# Patient Record
Sex: Male | Born: 1985 | Race: Black or African American | Hispanic: No | Marital: Married | Smoking: Former smoker
Health system: Southern US, Community
[De-identification: ages and names within clinical notes are randomized; demographics above are authoritative.]

---

## 2009-09-10 MED ORDER — TETANUS AND DIPHTHERIA TOX (PF) 5 LF UNIT-2 LF UNIT/0.5 ML IM SYRINGE
5-2 Lf unit/0.5 mL | Freq: Once | INTRAMUSCULAR | Status: AC
Start: 2009-09-10 — End: 2009-09-10
  Administered 2009-09-10: 18:00:00 via INTRAMUSCULAR
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
## 2013-06-18 ENCOUNTER — Inpatient Hospital Stay
Admit: 2013-06-18 | Discharge: 2013-06-18 | Disposition: A | Discharge status: Home / Self Care | Attending: Emergency Medicine

## 2013-06-18 MED ORDER — CYCLOBENZAPRINE HCL 10 MG PO TABS
10 MG | ORAL_TABLET | Freq: Three times a day (TID) | ORAL | Status: AC | PRN
Start: 2013-06-18 — End: ?

## 2013-06-18 MED ORDER — IBUPROFEN 800 MG PO TABS
800 MG | ORAL_TABLET | Freq: Three times a day (TID) | ORAL | Status: AC | PRN
Start: 2013-06-18 — End: ?

## 2013-06-18 MED ORDER — PREDNISONE 50 MG PO TABS
50 MG | ORAL_TABLET | Freq: Every day | ORAL | Status: AC
Start: 2013-06-18 — End: 2013-06-23

## 2013-06-18 MED ORDER — TRAMADOL HCL 50 MG PO TABS
50 MG | ORAL_TABLET | Freq: Four times a day (QID) | ORAL | Status: AC | PRN
Start: 2013-06-18 — End: ?

## 2013-06-18 MED ADMIN — ondansetron (ZOFRAN) tablet 4 mg: 4 mg | ORAL | @ 14:00:00 | NDC 51079052401

## 2013-06-18 MED ADMIN — HYDROmorphone HCl PF (DILAUDID) injection 1 mg: 1 mg | INTRAMUSCULAR | @ 14:00:00 | NDC 00409128331

## 2013-06-18 MED FILL — HYDROMORPHONE HCL PF 1 MG/ML IJ SOLN: 1 MG/ML | INTRAMUSCULAR | Qty: 1

## 2013-06-18 MED FILL — ONDANSETRON HCL 4 MG PO TABS: 4 MG | ORAL | Qty: 1

## 2013-06-18 NOTE — ED Provider Notes (Signed)
[facility]    Pt Name: Gabriel Mitchell  MRN: [id number]  [place] 1985/10/08  Date of evaluation: 06/18/2013      CHIEF COMPLAINT       Chief Complaint   Patient presents with   ??? Back Pain     Pt c/o sudden onset of middle/low back pain while working out rowing. States this has happened 5-6 times before. States pain radiates to left hip.         HISTORY OF PRESENT ILLNESS     Gabriel Mitchell is a 28 y.o. male who presents with back pain.  Back pain be started 2 hours ago and is a 7 out of a 10 and sharp stabbing sensation.  The patient has been using over-the-counter medication without relief of symptoms.  The pain does not radiate down either leg.  There is no leg weakness or numbness.  There is no cervical or thoracic pain.  There was no direct trauma to the back.  There are no associated abdominal symptoms.  There are no bowel or bladder problems.  There's been no fever or other constitutional symptoms.  Brought here by relatives.          REVIEW OF SYSTEMS         REVIEW OF SYSTEMS    Constitutional:  Denies fever, chills, or weakness   Eyes:  Denies vision changes  HEENT:  Denies sore throat or neck pain   Respiratory:  Denies cough or shortness of breath   Cardiovascular:  Denies chest pain  GI:  Denies abdominal pain, nausea, vomiting, or diarrhea   Musculoskeletal:  As above  Skin:  No rash on exposed surfaces   Neurologic:  Normal baseline mentation. No new deficits.  Lymphatic:   No nodes or infection  Psychiatric:  No psychosis. Alert and interacting normally.  Other ROS negative except as noted above.    PAST MEDICAL HISTORY    has no past medical history on file.    SURGICAL HISTORY      has no past surgical history on file.    CURRENT MEDICATIONS       Previous Medications    No medications on file       ALLERGIES     has No Known Allergies.    FAMILY HISTORY     has no family status information on file.    family history is not on file.    SOCIAL HISTORY      reports that he has never  smoked. He does not have any smokeless tobacco history on file. He reports that he drinks alcohol. He reports that he does not use illicit drugs.    PHYSICAL EXAM     INITIAL VITALS:  height is 5' 9"$  (1.753 m) and weight is 97.523 kg (215 lb). His oral temperature is 97.9 ??F (36.6 ??C). His blood pressure is 122/70 and his pulse is 68. His respiration is 16 and oxygen saturation is 98%.     Constitutional: The patient is alert and well-developed, vital signs as noted.   Psychiatric: Oriented to time, place and person, affect is appropriate for age.   Eyes: Pupils equal and reactive to light.   Ears, nose, and throat: Oropharynx clear  Neck: No masses, trachea midline, and no thyromegaly.   Chest: Without deformities, chest wall symmetrical, there is no tenderness with palpation.   Respiratory: Clear to auscultation, full aeration throughout all fields.   Cardiovascular: No murmurs, heart sounds normal, no thrills.  Gastrointestinal: No masses, no hepatosplenomegaly, bowel sounds positive.  No tenderness.  Skin: No rashes or lesions on exposed surfaces, good skin turgor.   Neurological: Deep tendon reflexes 2+, sensation intact bilaterally, no focal neurological findings.  Extremities: Good range of motion, no edema.  Back: The lumbar spine demonstrates tenderness palpation of the paravertebral musculature without midline tenderness.  There is decreased range of motion due to pain.  No pain to palpation of the spinous processes and there is no step-off.  The cervical and thoracic exams are normal.      DIFFERENTIAL DIAGNOSIS/ MEDICAL DECISION MAKING:     Prednisone Flexeril and Ultram as advised    Dilaudid and Zofran are given with pain relief    Ambulatory without deficit    Follow Exit Care instructions closely.    I have reviewed the disposition diagnosis with the patient and or their family/guardian. I have answered their questions and given discharge instructions. They voiced understanding of these instructions  and did not have any further questions or complaints.    DIAGNOSTIC RESULTS     RADIOLOGY:   Non-plain film images such as CT, Ultrasound and MRI are read by the radiologist. Plain radiographic images are visualized and preliminarily interpreted by the emergency physician with the below findings:       LABS:  No results found for this visit on 06/18/13.    ABNORMAL LABS:  Labs Reviewed - No data to display     EKG: All EKG's are interpreted by the Emergency Department Physician who either signs or Co-signs this chart in the absence of a cardiologist.        EMERGENCY DEPARTMENT COURSE:   Vitals:    Filed Vitals:    06/18/13 0918   BP: 122/70   Pulse: 68   Temp: 97.9 ??F (36.6 ??C)   TempSrc: Oral   Resp: 16   Height: 5' 9"$  (1.753 m)   Weight: 97.523 kg (215 lb)   SpO2: 98%     -------------------------  BP: 122/70 mmHg, Temp: 97.9 ??F (36.6 ??C), Pulse: 68, Resp: 16    See DDX/MDM (Differential Diagnosis/Medical Decision Making) above      FINAL IMPRESSION      1. Lumbar strain, initial encounter          DISPOSITION/PLAN   DISPOSITION Decision to Discharge    Condition on Disposition    Fair    PATIENT REFERRED TO:  Julien Girt, MD  [address]  [address]  [place] [address]  [phone]    In 2 days        DISCHARGE MEDICATIONS:  New Prescriptions    CYCLOBENZAPRINE (FLEXERIL) 10 MG TABLET    Take 0.5 tablets by mouth 3 times daily as needed for Muscle spasms    IBUPROFEN (ADVIL;MOTRIN) 800 MG TABLET    Take 1 tablet by mouth every 8 hours as needed for Pain    PREDNISONE (DELTASONE) 50 MG TABLET    Take 1 tablet by mouth daily for 5 days       (Please note that portions of this note were completed with a voice recognition program.  Efforts were made to edit the dictations but occasionally words are mis-transcribed.)    Alexis Goodell Alinah Sheard,, DO  Attending Emergency Physician        [place], DO  06/18/13 [phone]

## 2013-06-18 NOTE — ED Notes (Signed)
Dr. Fought at bedside for re-eval.    Charm RingsKatie Gerken, RN  06/18/13 [phone]

## 2017-10-25 ENCOUNTER — Encounter (HOSPITAL_COMMUNITY): Payer: Self-pay | Admitting: *Deleted

## 2017-10-25 ENCOUNTER — Emergency Department (HOSPITAL_COMMUNITY)
Admission: EM | Admit: 2017-10-25 | Discharge: 2017-10-25 | Disposition: A | Discharge status: Home / Self Care | Attending: Emergency Medicine | Admitting: Emergency Medicine

## 2017-10-25 DIAGNOSIS — F17210 Nicotine dependence, cigarettes, uncomplicated: Secondary | ICD-10-CM | POA: Insufficient documentation

## 2017-10-25 DIAGNOSIS — M545 Low back pain, unspecified: Secondary | ICD-10-CM

## 2017-10-25 MED ORDER — KETOROLAC TROMETHAMINE 60 MG/2ML IM SOLN
60.0000 mg | Freq: Once | INTRAMUSCULAR | Status: AC
  Administered 2017-10-25: 60 mg via INTRAMUSCULAR
  Filled 2017-10-25: qty 2

## 2017-10-25 MED ORDER — METHOCARBAMOL 500 MG PO TABS: 500.0000 mg | ORAL_TABLET | Freq: Three times a day (TID) | ORAL | 0 refills | Status: AC

## 2017-10-25 NOTE — Discharge Instructions (Addendum)
You you were seen in the emergency department for back pain.  I suspect your pain is either from a muscular overuse injury, spasm or low grade strain.  We will treat this with a combination of anti-inflammatories, muscle relaxers, stretches, rest. 1000 mg of acetaminophen (Tylenol) every 8 hours for the next 3-5 days 600 mg of ibuprofen (motrin, advil) every 8 hours for the next 3-5 days 500 mg methocarbamol (robaxin) every 8 hours for the next 3 days for associated spasms and tightness Rest for the next 48 hours to avoid further injury Transition back into daily activity slowly to avoid reinjury Over-the-counter lidocaine-containing patches can be applied to the area of pain for 12 hours at a time Light massage, stretch, heat   Return to the ER if your pain worsens, you develop abdominal pain, urinary symptoms, changes to your bowel movements, numbness in your groin, loss of bladder or bowel control, loss of sensation or heaviness or weakness to your extremities, fevers

## 2017-10-25 NOTE — ED Triage Notes (Signed)
Pt in stating he threw his back out this morning, he was carrying someone and had sudden onset of pain, ambulatory to room without distress, has taken two 800mg  ibuprofen this am

## 2017-10-25 NOTE — ED Provider Notes (Signed)
[facility] EMERGENCY DEPARTMENT Provider Note   CSN: [id number] Arrival date & time: 10/25/17  4098     History   Chief Complaint Chief Complaint  Patient presents with  . Back Pain    HPI Barry Hayes is a 32 y.o. male here for evaluation of back pain. Sudden onset around 7 am today. 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  Pain is a 8/10. Pain is worse with movement of trunk, better with rest and ibuprofen.  He had a fall off a chair 1 month ago but none recently.  States he has had similar episodes of pain in the past multiple times that have gotten better with muscle relaxer.   No fevers, chills, abdominal pain, urinary symptoms, groin numbness, loss of sensation or weakness to extremities.    HPI  History reviewed. No pertinent past medical history.  There are no active problems to display for this patient.   History reviewed. No pertinent surgical history.      Home Medications    Prior to Admission medications   Medication Sig Start Date End Date Taking? Authorizing Provider  methocarbamol (ROBAXIN) 500 MG tablet Take 1 tablet (500 mg total) by mouth 3 (three) times daily for 3 days. 10/25/17 10/28/17  Liberty Handy, PA-C    Family History History reviewed. No pertinent family history.  Social History Social History   Tobacco Use  . Smoking status: Current Some Day Smoker  . Smokeless tobacco: Never Used  Substance Use Topics  . Alcohol use: Not on file  . Drug use: Not on file     Allergies   Patient has no known allergies.   Review of Systems Review of Systems  Musculoskeletal: Positive for back pain and myalgias.  All other systems reviewed and are negative.    Physical Exam Updated Vital Signs BP 127/79 (BP Location: Right Arm)   Pulse 72   Temp 98.5 F (36.9 C) (Oral)   Resp 20   SpO2 100%   Physical Exam  Constitutional: He appears well-developed and  well-nourished. No distress.  HENT:  Head: Normocephalic and atraumatic.  Nose: Nose normal.  Eyes: EOM are normal.  Cardiovascular: Normal rate and normal heart sounds.  Pulses:      Radial pulses are 2+ on the right side, and 2+ on the left side.       Dorsalis pedis pulses are 2+ on the right side, and 2+ on the left side.  Pulmonary/Chest: Effort normal and breath sounds normal.  Abdominal: Soft. There is no tenderness.  No suprapubic or CVA tenderness   Musculoskeletal: He exhibits tenderness.       Lumbar back: He exhibits tenderness and pain.  T-spine: no midline or paraspinal tenderness L-spine: no midline or paraspinal tenderness.  No SI or sciatic notch tenderness. Negative SLR bilaterally.  Pelvis: no pain or crepitus with IR/ER/downward pressure of hips bilaterally. No AP/L instability noted with compression. No leg shortening or rotation.    Neurological: He is alert. He has normal strength. No sensory deficit.  Can lift and hold legs without unilateral weakness or drift 5/5 strength with flexion/extension of hip, knee and ankle, bilaterally.  Sensation to light touch intact in lower extremities including feet  Skin: Skin is warm and dry. Capillary refill takes less than 2 seconds.  No overlaying rash to back   Psychiatric: His behavior is normal. Thought content normal.     ED  Treatments / Results  Labs (all labs ordered are listed, but only abnormal results are displayed) Labs Reviewed - No data to display  EKG None  Radiology No results found.  Procedures Procedures (including critical care time)  Medications Ordered in ED Medications  ketorolac (TORADOL) injection 60 mg (60 mg Intramuscular Given 10/25/17 1029)     Initial Impression / Assessment and Plan / ED Course  I have reviewed the triage vital signs and the nursing notes.  Pertinent labs & imaging results that were available during my care of the patient were reviewed by me and considered in  my medical decision making (see chart for details).     Palpation during exam did not make pain worse, movement of trunk exacerbated symptoms suggesting likely MSK etiology. Abdominal exam benign, without pulsatility, suprapubic or CVA tenderness. Distal pulses symmetric bilaterally. No focal neurological deficits. No overlaying rash. Considered UTI/pyelo, kidney stone, cauda equina, epidural abscess, dissection considered but these don't fit clinical picture. Favoring strain vs spasm.  No red flag features of back pain present such as saddle anesthesia, bladder/bowel incontinence or retention, fevers, h/o cancer, IVDU, preceding trauma or falls, unilateral weakness, urinary symptoms. Emergent imaging not thought to be indicated today as physical exam reassuring, no midline tenderness no recent falls.  Conservative measures such as ice/heat, mild stretches, muscle relaxer and high dose NSAIDs indicated with PCP follow-up if no improvement with conservative management. ED return precautions discussed with patient who verbalized understanding and is agreeable to plan.   Final Clinical Impressions(s) / ED Diagnoses   Final diagnoses:  Acute bilateral low back pain without sciatica    ED Discharge Orders         Ordered    methocarbamol (ROBAXIN) 500 MG tablet  3 times daily     10/25/17 1033           Liberty Handy, [place] 10/25/17 1124    Mesner, Barbara Cower, MD 10/25/17 [phone]

## 2017-12-30 ENCOUNTER — Encounter: Payer: Self-pay | Admitting: Emergency Medicine

## 2017-12-30 ENCOUNTER — Emergency Department
Admission: EM | Admit: 2017-12-30 | Discharge: 2017-12-30 | Disposition: A | Discharge status: Home / Self Care | Attending: Emergency Medicine | Admitting: Emergency Medicine

## 2017-12-30 DIAGNOSIS — B349 Viral infection, unspecified: Secondary | ICD-10-CM

## 2017-12-30 DIAGNOSIS — E860 Dehydration: Secondary | ICD-10-CM | POA: Diagnosis not present

## 2017-12-30 DIAGNOSIS — Z87891 Personal history of nicotine dependence: Secondary | ICD-10-CM | POA: Diagnosis not present

## 2017-12-30 DIAGNOSIS — R1110 Vomiting, unspecified: Secondary | ICD-10-CM | POA: Diagnosis not present

## 2017-12-30 DIAGNOSIS — R197 Diarrhea, unspecified: Secondary | ICD-10-CM | POA: Diagnosis not present

## 2017-12-30 DIAGNOSIS — R509 Fever, unspecified: Secondary | ICD-10-CM | POA: Diagnosis present

## 2017-12-30 LAB — URINALYSIS, COMPLETE (UACMP) WITH MICROSCOPIC
BACTERIA UA: NONE SEEN
Bilirubin Urine: NEGATIVE
Glucose, UA: NEGATIVE mg/dL
HGB URINE DIPSTICK: NEGATIVE
KETONES UR: NEGATIVE mg/dL
Leukocytes, UA: NEGATIVE
NITRITE: NEGATIVE
PROTEIN: NEGATIVE mg/dL
Specific Gravity, Urine: 1.025 (ref 1.005–1.030)
Squamous Epithelial / LPF: NONE SEEN (ref 0–5)
pH: 7 (ref 5.0–8.0)

## 2017-12-30 LAB — COMPREHENSIVE METABOLIC PANEL
ALBUMIN: 4.6 g/dL (ref 3.5–5.0)
ALT: 22 U/L (ref 0–44)
AST: 35 U/L (ref 15–41)
Alkaline Phosphatase: 62 U/L (ref 38–126)
Anion gap: 9 (ref 5–15)
BUN: 11 mg/dL (ref 6–20)
CHLORIDE: 102 mmol/L (ref 98–111)
CO2: 26 mmol/L (ref 22–32)
Calcium: 9.5 mg/dL (ref 8.9–10.3)
Creatinine, Ser: 1.52 mg/dL — ABNORMAL HIGH (ref 0.61–1.24)
GFR calc Af Amer: >60 mL/min (ref >60)
GFR calc non Af Amer: 59 mL/min — ABNORMAL LOW (ref >60)
GLUCOSE: 138 mg/dL — AB (ref 70–99)
POTASSIUM: 4.4 mmol/L (ref 3.5–5.1)
SODIUM: 137 mmol/L (ref 135–145)
Total Bilirubin: 0.7 mg/dL (ref 0.3–1.2)
Total Protein: 8.3 g/dL — ABNORMAL HIGH (ref 6.5–8.1)

## 2017-12-30 LAB — CBC
HEMATOCRIT: 40.8 % (ref 39.0–52.0)
HEMOGLOBIN: 13.3 g/dL (ref 13.0–17.0)
MCH: 27.9 pg (ref 26.0–34.0)
MCHC: 32.6 g/dL (ref 30.0–36.0)
MCV: 85.5 fL (ref 80.0–100.0)
Platelets: 278 10*3/uL (ref 150–400)
RBC: 4.77 MIL/uL (ref 4.22–5.81)
RDW: 12.5 % (ref 11.5–15.5)
WBC: 8.9 10*3/uL (ref 4.0–10.5)
nRBC: 0 % (ref 0.0–0.2)

## 2017-12-30 LAB — LIPASE, BLOOD: LIPASE: 23 U/L (ref 11–51)

## 2017-12-30 LAB — INFLUENZA PANEL BY PCR (TYPE A & B)
INFLAPCR: NEGATIVE
Influenza B By PCR: NEGATIVE

## 2017-12-30 MED ORDER — BUTALBITAL-APAP-CAFFEINE 50-325-40 MG PO TABS: 1.0000 | ORAL_TABLET | Freq: Four times a day (QID) | ORAL | 0 refills | Status: AC | PRN

## 2017-12-30 MED ORDER — BUTALBITAL-APAP-CAFFEINE 50-325-40 MG PO TABS
2.0000 | ORAL_TABLET | Freq: Once | ORAL | Status: AC
  Administered 2017-12-30: 2 via ORAL
  Filled 2017-12-30: qty 2

## 2017-12-30 MED ORDER — ONDANSETRON 4 MG PO TBDP
4.0000 mg | ORAL_TABLET | Freq: Once | ORAL | Status: AC | PRN
  Administered 2017-12-30: 4 mg via ORAL
  Filled 2017-12-30: qty 1

## 2017-12-30 MED ORDER — LOPERAMIDE HCL 2 MG PO CAPS
4.0000 mg | ORAL_CAPSULE | Freq: Once | ORAL | Status: AC
  Administered 2017-12-30: 4 mg via ORAL
  Filled 2017-12-30: qty 2

## 2017-12-30 MED ORDER — ACETAMINOPHEN 500 MG PO TABS
1000.0000 mg | ORAL_TABLET | Freq: Once | ORAL | Status: AC
  Administered 2017-12-30: 1000 mg via ORAL
  Filled 2017-12-30: qty 2

## 2017-12-30 NOTE — Discharge Instructions (Signed)
Please take over-the-counter Imodium up to 8 times a day as needed for diarrhea and make sure you remain well-hydrated.  I expect you to be sick for another 3 days or so.  It was a pleasure to take care of you today, and thank you for coming to our emergency department.  If you have any questions or concerns before leaving please ask the nurse to grab me and I'm more than happy to go through your aftercare instructions again.  If you were prescribed any opioid pain medication today such as Norco, Vicodin, Percocet, morphine, hydrocodone, or oxycodone please make sure you do not drive when you are taking this medication as it can alter your ability to drive safely.  If you have any concerns once you are home that you are not improving or are in fact getting worse before you can make it to your follow-up appointment, please do not hesitate to call 911 and come back for further evaluation.  Merrily BrittleNeil Sanyiah Kanzler, MD  Results for orders placed or performed during the hospital encounter of 12/30/17  Lipase, blood  Result Value Ref Range   Lipase 23 11 - 51 U/L  Comprehensive metabolic panel  Result Value Ref Range   Sodium 137 135 - 145 mmol/L   Potassium 4.4 3.5 - 5.1 mmol/L   Chloride 102 98 - 111 mmol/L   CO2 26 22 - 32 mmol/L   Glucose, Bld 138 (H) 70 - 99 mg/dL   BUN 11 6 - 20 mg/dL   Creatinine, Ser 2.131.52 (H) 0.61 - 1.24 mg/dL   Calcium 9.5 8.9 - 08.610.3 mg/dL   Total Protein 8.3 (H) 6.5 - 8.1 g/dL   Albumin 4.6 3.5 - 5.0 g/dL   AST 35 15 - 41 U/L   ALT 22 0 - 44 U/L   Alkaline Phosphatase 62 38 - 126 U/L   Total Bilirubin 0.7 0.3 - 1.2 mg/dL   GFR calc non Af Amer 59 (L) >60 mL/min   GFR calc Af Amer >60 >60 mL/min   Anion gap 9 5 - 15  CBC  Result Value Ref Range   WBC 8.9 4.0 - 10.5 K/uL   RBC 4.77 4.22 - 5.81 MIL/uL   Hemoglobin 13.3 13.0 - 17.0 g/dL   HCT 57.840.8 46.939.0 - 62.952.0 %   MCV 85.5 80.0 - 100.0 fL   MCH 27.9 26.0 - 34.0 pg   MCHC 32.6 30.0 - 36.0 g/dL   RDW 52.812.5 41.311.5 - 24.415.5 %     Platelets 278 150 - 400 K/uL   nRBC 0.0 0.0 - 0.2 %  Urinalysis, Complete w Microscopic  Result Value Ref Range   Color, Urine YELLOW (A) YELLOW   APPearance CLEAR (A) CLEAR   Specific Gravity, Urine 1.025 1.005 - 1.030   pH 7.0 5.0 - 8.0   Glucose, UA NEGATIVE NEGATIVE mg/dL   Hgb urine dipstick NEGATIVE NEGATIVE   Bilirubin Urine NEGATIVE NEGATIVE   Ketones, ur NEGATIVE NEGATIVE mg/dL   Protein, ur NEGATIVE NEGATIVE mg/dL   Nitrite NEGATIVE NEGATIVE   Leukocytes, UA NEGATIVE NEGATIVE   RBC / HPF 0-5 0 - 5 RBC/hpf   WBC, UA 0-5 0 - 5 WBC/hpf   Bacteria, UA NONE SEEN NONE SEEN   Squamous Epithelial / LPF NONE SEEN 0 - 5   Mucus PRESENT   Influenza panel by PCR (type A & B)  Result Value Ref Range   Influenza A By PCR NEGATIVE NEGATIVE   Influenza B By PCR  NEGATIVE NEGATIVE

## 2017-12-30 NOTE — ED Notes (Signed)
Reviewed discharge instructions, follow-up care, and prescriptions with patient. Patient verbalized understanding of all information reviewed. Patient stable, with no distress noted at this time.    

## 2017-12-30 NOTE — ED Provider Notes (Signed)
[facility] Emergency Department Provider Note  ____________________________________________   First MD Initiated Contact with Patient 12/30/17 [phone]     (approximate)  I have reviewed the triage vital signs and the nursing notes.   HISTORY  Chief Complaint Fever; Emesis; and Diarrhea   HPI Barry Hayes is a 32 y.o. male who comes to the emergency department with fever, abdominal pain, nausea vomiting, and diarrhea along with myalgias and body aches that began acutely at 6 PM roughly 8 hours prior to arrival.  Had a temperature of 101 degrees at home and has vomited a total of 3 times and has had 2 loose stools that were not entirely watery.  No recent antibiotics.  Does not eat shellfish regularly.  No recent international travel.  Symptoms came on suddenly were severe and have actually slowly begun to improve.  He does feel dehydrated.   Has taken no medications and nothing seems to help.   History reviewed. No pertinent past medical history.  There are no active problems to display for this patient.   History reviewed. No pertinent surgical history.  Prior to Admission medications   Medication Sig Start Date End Date Taking? Authorizing Provider  butalbital-acetaminophen-caffeine (FIORICET, ESGIC) 50-325-40 MG tablet Take 1-2 tablets by mouth every 6 (six) hours as needed. 12/30/17 12/30/18  Merrily Brittleifenbark, Donnita Farina, MD    Allergies Patient has no known allergies.  No family history on file.  Social History Social History   Tobacco Use  . Smoking status: Former Games developermoker  . Smokeless tobacco: Never Used  Substance Use Topics  . Alcohol use: Not on file  . Drug use: Not on file    Review of Systems Constitutional: No fever/chills Eyes: No visual changes. ENT: No sore throat. Cardiovascular: Denies chest pain. Respiratory: Denies shortness of breath. Gastrointestinal: Positive for abdominal pain.  Positive for nausea, positive for vomiting.   Positive for diarrhea.  No constipation. Genitourinary: Negative for dysuria. Musculoskeletal: Negative for back pain. Skin: Negative for rash. Neurological: Positive for headache   ____________________________________________   PHYSICAL EXAM:  VITAL SIGNS: ED Triage Vitals  Enc Vitals Group     BP 12/30/17 0200 115/62     Pulse Rate 12/30/17 0200 (!) 105     Resp 12/30/17 0200 18     Temp 12/30/17 0200 (!) 101.5 F (38.6 C)     Temp Source 12/30/17 0200 Oral     SpO2 12/30/17 0200 98 %     Weight 12/30/17 0201 230 lb (104.3 kg)     Height 12/30/17 0201 5\' 9"  (1.753 m)     Head Circumference --      Peak Flow --      Pain Score 12/30/17 0201 7     Pain Loc --      Pain Edu? --      Excl. in GC? --     Constitutional: Alert and oriented x4 appears somewhat uncomfortable holding his abdomen although nontoxic in no diaphoresis Eyes: PERRL EOMI. Head: Atraumatic. Nose: No congestion/rhinnorhea. Mouth/Throat: No trismus Neck: No stridor.  Uvula midline no pharyngeal erythema or exudate Cardiovascular: Tachycardic rate, regular rhythm. Grossly normal heart sounds.  Good peripheral circulation. Respiratory: Increased respiratory effort.  No retractions. Lungs CTAB and moving good air Gastrointestinal: Soft mild diffuse tenderness with no rebound or guarding no peritonitis no focality.  Hyperactive bowel sounds Musculoskeletal: No lower extremity edema   Neurologic:  Normal speech and language. No gross focal neurologic deficits are appreciated. Skin:  Skin is warm, dry and intact. No rash noted. Psychiatric: Mood and affect are normal. Speech and behavior are normal.    ____________________________________________   DIFFERENTIAL includes but not limited to  Viral gastroenteritis, bacterial gastroenteritis, influenza, influenza-like illness, C. difficile, appendicitis ____________________________________________   LABS (all labs ordered are listed, but only abnormal  results are displayed)  Labs Reviewed  COMPREHENSIVE METABOLIC PANEL - Abnormal; Notable for the following components:      Result Value   Glucose, Bld 138 (*)    Creatinine, Ser 1.52 (*)    Total Protein 8.3 (*)    GFR calc non Af Amer 59 (*)    All other components within normal limits  URINALYSIS, COMPLETE (UACMP) WITH MICROSCOPIC - Abnormal; Notable for the following components:   Color, Urine YELLOW (*)    APPearance CLEAR (*)    All other components within normal limits  LIPASE, BLOOD  CBC  INFLUENZA PANEL BY PCR (TYPE A & B)    Lab work reviewed by me with elevated creatinine.  I have no previous results to go by but this is likely acute kidney injury __________________________________________  EKG   ____________________________________________  RADIOLOGY   ____________________________________________   PROCEDURES  Procedure(s) performed: no  Procedures  Critical Care performed: no  ____________________________________________   INITIAL IMPRESSION / ASSESSMENT AND PLAN / ED COURSE  Pertinent labs & imaging results that were available during my care of the patient were reviewed by me and considered in my medical decision making (see chart for details).   As part of my medical decision making, I reviewed the following data within the electronic MEDICAL RECORD NUMBER History obtained from family if available, nursing notes, old chart and ekg, as well as notes from prior ED visits.  Patient comes to the emergency department febrile with myalgias nausea vomiting and diarrhea.  His constellation of symptoms are most consistent with viral syndrome.  Given a liter of IV fluids, Zofran, Imodium, and Fioricet for his headache with significant improvement.  I had a lengthy discussion with the patient regarding the diagnostic uncertainty and that it certainly is possible that this could represent an early appendicitis however I did not believe that this was the case and did  not think he would benefit from advanced imaging at this time.  We discussed the correct way to take loperamide and most importantly discussed that if his symptoms do not improve in the morning or if they worsen he is to return for further work-up and evaluation.  He understands that he is contagious and everyone at home is to be washing her hands.  Strict return precautions have been given.      ____________________________________________   FINAL CLINICAL IMPRESSION(S) / ED DIAGNOSES  Final diagnoses:  Dehydration  Diarrhea of presumed infectious origin  Viral syndrome      NEW MEDICATIONS STARTED DURING THIS VISIT:  Discharge Medication List as of 12/30/2017  5:09 AM    START taking these medications   Details  butalbital-acetaminophen-caffeine (FIORICET, ESGIC) 50-325-40 MG tablet Take 1-2 tablets by mouth every 6 (six) hours as needed., Starting Mon 12/30/2017, Until Tue 12/30/2018, Print         Note:  This document was prepared using Dragon voice recognition software and may include unintentional dictation errors.    Merrily Brittleifenbark, Charmine Bockrath, MD 01/03/18 [id number]

## 2017-12-30 NOTE — ED Triage Notes (Signed)
Patient reports fever, diarrhea, vomiting and body aches that started last night about 18:00. Patient states that he had a fever of 101 at home. Patient states that he vomited times three and had two loose stools.

## 2019-07-07 ENCOUNTER — Other Ambulatory Visit: Payer: Self-pay

## 2019-07-07 ENCOUNTER — Emergency Department

## 2019-07-07 ENCOUNTER — Emergency Department
Admission: EM | Admit: 2019-07-07 | Discharge: 2019-07-07 | Disposition: A | Discharge status: Home / Self Care | Attending: Emergency Medicine | Admitting: Emergency Medicine

## 2019-07-07 ENCOUNTER — Encounter: Payer: Self-pay | Admitting: Emergency Medicine

## 2019-07-07 DIAGNOSIS — R1013 Epigastric pain: Secondary | ICD-10-CM

## 2019-07-07 DIAGNOSIS — R1084 Generalized abdominal pain: Secondary | ICD-10-CM | POA: Insufficient documentation

## 2019-07-07 DIAGNOSIS — R1010 Upper abdominal pain, unspecified: Secondary | ICD-10-CM | POA: Diagnosis present

## 2019-07-07 DIAGNOSIS — Z87891 Personal history of nicotine dependence: Secondary | ICD-10-CM | POA: Insufficient documentation

## 2019-07-07 LAB — COMPREHENSIVE METABOLIC PANEL
ALT: 33 U/L (ref 0–44)
AST: 50 U/L — ABNORMAL HIGH (ref 15–41)
Albumin: 3.9 g/dL (ref 3.5–5.0)
Alkaline Phosphatase: 74 U/L (ref 38–126)
Anion gap: 9 (ref 5–15)
BUN: 12 mg/dL (ref 6–20)
CO2: 27 mmol/L (ref 22–32)
Calcium: 9.5 mg/dL (ref 8.9–10.3)
Chloride: 101 mmol/L (ref 98–111)
Creatinine, Ser: 1.37 mg/dL — ABNORMAL HIGH (ref 0.61–1.24)
GFR calc Af Amer: >60 mL/min (ref >60)
GFR calc non Af Amer: >60 mL/min (ref >60)
Glucose, Bld: 160 mg/dL — ABNORMAL HIGH (ref 70–99)
Potassium: 3.5 mmol/L (ref 3.5–5.1)
Sodium: 137 mmol/L (ref 135–145)
Total Bilirubin: 0.7 mg/dL (ref 0.3–1.2)
Total Protein: 7.7 g/dL (ref 6.5–8.1)

## 2019-07-07 LAB — CBC
HCT: 38.5 % — ABNORMAL LOW (ref 39.0–52.0)
Hemoglobin: 12.8 g/dL — ABNORMAL LOW (ref 13.0–17.0)
MCH: 28.6 pg (ref 26.0–34.0)
MCHC: 33.2 g/dL (ref 30.0–36.0)
MCV: 85.9 fL (ref 80.0–100.0)
Platelets: 243 10*3/uL (ref 150–400)
RBC: 4.48 MIL/uL (ref 4.22–5.81)
RDW: 12.5 % (ref 11.5–15.5)
WBC: 12.3 10*3/uL — ABNORMAL HIGH (ref 4.0–10.5)
nRBC: 0 % (ref 0.0–0.2)

## 2019-07-07 LAB — LIPASE, BLOOD: Lipase: 22 U/L (ref 11–51)

## 2019-07-07 LAB — GLUCOSE, CAPILLARY: Glucose-Capillary: 140 mg/dL — ABNORMAL HIGH (ref 70–99)

## 2019-07-07 LAB — TROPONIN I (HIGH SENSITIVITY): Troponin I (High Sensitivity): 6 ng/L (ref <18)

## 2019-07-07 MED ORDER — ONDANSETRON 4 MG PO TBDP: 4.0000 mg | ORAL_TABLET | Freq: Once | ORAL | Status: DC | PRN

## 2019-07-07 MED ORDER — SODIUM CHLORIDE 0.9% FLUSH: 3.0000 mL | Freq: Once | INTRAVENOUS | Status: DC

## 2019-07-07 MED ORDER — ONDANSETRON 4 MG PO TBDP: ORAL_TABLET | ORAL | 0 refills | Status: AC

## 2019-07-07 NOTE — Discharge Instructions (Signed)

## 2019-07-07 NOTE — ED Provider Notes (Signed)
[facility] Emergency Department Provider Note  ____________________________________________   First MD Initiated Contact with Patient 07/07/19 [phone]     (approximate)  I have reviewed the triage vital signs and the nursing notes.   HISTORY  Chief Complaint Abdominal Pain    HPI Barry Hayes is a 34 y.o. male who reports no chronic medical issues and presents for evaluation of acute onset and severe upper and generalized abdominal pain earlier tonight.  He said he has had a generalized headache and sinus pressure with intermittent fever for the last couple of days.  He went to a [facility] urgent care yesterday and was prescribed a Z-Pak.  Tonight  at some point after eating dinner, between 9 and 10:00 PM, he had acute onset of upper abdominal pain associated with nausea that he said was severe, felt like something grabbing him in his abdomen as well as being sharp, and was also associated with an urgency to have a bowel movement although he was unable to do so.  Symptoms lasted for a few hours before he came to the emergency department.  After getting to the emergency department his symptoms resolved completely and he was able to get some rest.  He no longer has any pain or discomfort including nausea.  Of note he reports that his fever was as high as 104 degrees measured with a forehead thermometer over the course of the day yesterday.  He has had at least 2 doses of azithromycin and said that his sinusitis symptoms were improving.  He has never had symptoms like this in the past.  He has no history of abdominal surgery or prior diagnoses of gallstones, peptic ulcer or gastric ulcers, etc.  He received both doses of COVID-19 vaccination within the last several months.        History reviewed. No pertinent past medical history.  There are no problems to display for this patient.   History reviewed. No pertinent surgical history.  Prior to Admission  medications   Medication Sig Start Date End Date Taking? Authorizing Provider  ondansetron (ZOFRAN ODT) 4 MG disintegrating tablet Allow 1-2 tablets to dissolve in your mouth every 8 hours as needed for nausea/vomiting 07/07/19   Loleta Rose, MD    Allergies Patient has no known allergies.  No family history on file.  Social History Social History   Tobacco Use  . Smoking status: Former Games developer  . Smokeless tobacco: Never Used  Vaping Use  . Vaping Use: Every day  Substance Use Topics  . Alcohol use: Not Currently  . Drug use: Not on file    Review of Systems Constitutional: +fever/chills Eyes: No visual changes. ENT: Sinus congestion and pressure.  No sore throat. Cardiovascular: Denies chest pain. Respiratory: Denies shortness of breath. Gastrointestinal: Upper abdominal pain and nausea, no vomiting, as described above. Genitourinary: Negative for dysuria. Musculoskeletal: Negative for neck pain.  Negative for back pain. Integumentary: Negative for rash. Neurological: Negative for headaches, focal weakness or numbness.   ____________________________________________   PHYSICAL EXAM:  VITAL SIGNS: ED Triage Vitals  Enc Vitals Group     BP 07/07/19 0106 (!) 114/57     Pulse Rate 07/07/19 0106 95     Resp 07/07/19 0106 20     Temp 07/07/19 0106 99.4 F (37.4 C)     Temp Source 07/07/19 0106 Oral     SpO2 07/07/19 0106 98 %     Weight 07/07/19 0111 102.1 kg (225 lb)  Height 07/07/19 0111 1.753 m (5\' 9" )     Head Circumference --      Peak Flow --      Pain Score 07/07/19 0111 10     Pain Loc --      Pain Edu? --      Excl. in GC? --     Constitutional: Alert and oriented.  Eyes: Conjunctivae are normal.  Head: Atraumatic. Nose: No congestion/rhinnorhea. Mouth/Throat: Patient is wearing a mask. Neck: No stridor.  No meningeal signs.   Cardiovascular: Normal rate, regular rhythm. Good peripheral circulation. Grossly normal heart sounds. Respiratory:  Normal respiratory effort.  No retractions. Gastrointestinal: Soft and nontender. No distention.  Musculoskeletal: No lower extremity tenderness nor edema. No gross deformities of extremities. Neurologic:  Normal speech and language. No gross focal neurologic deficits are appreciated.  Skin:  Skin is warm, dry and intact. Psychiatric: Mood and affect are normal. Speech and behavior are normal.  ____________________________________________   LABS (all labs ordered are listed, but only abnormal results are displayed)  Labs Reviewed  COMPREHENSIVE METABOLIC PANEL - Abnormal; Notable for the following components:      Result Value   Glucose, Bld 160 (*)    Creatinine, Ser 1.37 (*)    AST 50 (*)    All other components within normal limits  CBC - Abnormal; Notable for the following components:   WBC 12.3 (*)    Hemoglobin 12.8 (*)    HCT 38.5 (*)    All other components within normal limits  GLUCOSE, CAPILLARY - Abnormal; Notable for the following components:   Glucose-Capillary 140 (*)    All other components within normal limits  LIPASE, BLOOD  URINALYSIS, COMPLETE (UACMP) WITH MICROSCOPIC  CBG MONITORING, ED  TROPONIN I (HIGH SENSITIVITY)  TROPONIN I (HIGH SENSITIVITY)   ____________________________________________  EKG  ED ECG REPORT I, 07/09/19, the attending physician, personally viewed and interpreted this ECG.  Date: 07/07/2019 EKG Time: 1:22 AM Rate: 79 Rhythm: normal sinus rhythm QRS Axis: normal Intervals: normal ST/T Wave abnormalities: normal Narrative Interpretation: no evidence of acute ischemia  ____________________________________________  RADIOLOGY I, 07/09/2019, personally viewed and evaluated these images (plain radiographs) as part of my medical decision making, as well as reviewing the written report by the radiologist.  ED MD interpretation: No acute abnormalities identified on chest x-ray.  Minimal distention of the gallbladder but  without evidence of cholelithiasis or cholecystitis.  Official radiology report(s): DG Chest 2 View  Result Date: 07/07/2019 CLINICAL DATA:  Epigastric pain, chest pain, fever EXAM: CHEST - 2 VIEW COMPARISON:  None. FINDINGS: Frontal and lateral views of the chest demonstrate an unremarkable cardiac silhouette. No airspace disease, effusion, or pneumothorax. No acute bony abnormalities. IMPRESSION: 1. No acute intrathoracic process. Electronically Signed   By: 07/09/2019 M.D.   On: 07/07/2019 02:02   07/09/2019 Abdomen Limited RUQ  Result Date: 07/07/2019 CLINICAL DATA:  Epigastric pain for 5 hours EXAM: ULTRASOUND ABDOMEN LIMITED RIGHT UPPER QUADRANT COMPARISON:  None. FINDINGS: Gallbladder: Gallbladder is only minimally distended which limits its evaluation. No evidence of cholelithiasis or cholecystitis. Common bile duct: Diameter: 3 mm Liver: No focal lesion identified. Within normal limits in parenchymal echogenicity. Portal vein is patent on color Doppler imaging with normal direction of blood flow towards the liver. Other: None. IMPRESSION: 1. Minimal distension of the gallbladder which limits evaluation. No evidence of cholelithiasis or cholecystitis. 2. Otherwise unremarkable right upper quadrant ultrasound. Electronically Signed   By: 07/09/2019.D.  On: 07/07/2019 03:04    ____________________________________________   PROCEDURES   Procedure(s) performed (including Critical Care):  Procedures   ____________________________________________   INITIAL IMPRESSION / MDM / ASSESSMENT AND PLAN / ED COURSE  As part of my medical decision making, I reviewed the following data within the electronic MEDICAL RECORD NUMBER Nursing notes reviewed and incorporated, Labs reviewed , EKG interpreted , Old chart reviewed, Radiograph reviewed , Notes from prior ED visits and [place] Controlled Substance Database   Differential diagnosis includes, but is not limited to, gastritis, gallbladder disease,  gastric/peptic ulcer, pancreatitis, AAS, ACS, appendicitis.  The patient has been waiting for about 5 hours in the lobby due to overwhelming ED and hospital patient volumes.  During that time his pain has completely resolved and he has been able to sleep.  On my assessment he has no tenderness to palpation of the abdomen which I would expect even if he had appendicitis and had perforated and was having some momentary relief.  His vital signs have been stable and he has been afebrile during his stay.  He has a very mild leukocytosis of 12.3 which is nonspecific and an essentially normal comprehensive metabolic panel other than a very mild patient of his AST.  Lipase is normal.  Ultrasound is limited due to gallbladder distention but there is no evidence of cholelithiasis or cholecystitis.  Given the patient's history including the fever and the mild leukocytosis, I offered a CT scan of the abdomen and pelvis but I explained I thought it was unlikely that we would find an acute or emergent cause of his symptoms which have completely resolved.  Given that he is now asymptomatic he is comfortable with the plan for discharge and outpatient follow-up.  I gave strict return precautions if he develops new or worsening symptoms and he understands and agrees with the plan.           ____________________________________________  FINAL CLINICAL IMPRESSION(S) / ED DIAGNOSES  Final diagnoses:  Epigastric abdominal pain     MEDICATIONS GIVEN DURING THIS VISIT:  Medications  sodium chloride flush (NS) 0.9 % injection 3 mL (has no administration in time range)  ondansetron (ZOFRAN-ODT) disintegrating tablet 4 mg (has no administration in time range)     ED Discharge Orders         Ordered    ondansetron (ZOFRAN ODT) 4 MG disintegrating tablet     Discontinue  Reprint     07/07/19 0645          *Please note:  Barry Hayes was evaluated in Emergency Department on 07/07/2019 for the symptoms  described in the history of present illness. He was evaluated in the context of the global COVID-19 pandemic, which necessitated consideration that the patient might be at risk for infection with the SARS-CoV-2 virus that causes COVID-19. Institutional protocols and algorithms that pertain to the evaluation of patients at risk for COVID-19 are in a state of rapid change based on information released by regulatory bodies including the CDC and federal and state organizations. These policies and algorithms were followed during the patient's care in the ED.  Some ED evaluations and interventions may be delayed as a result of limited staffing during and after the pandemic.*  Note:  This document was prepared using Dragon voice recognition software and may include unintentional dictation errors.   Loleta Rose, MD 07/07/19 [phone]

## 2019-07-07 NOTE — ED Triage Notes (Addendum)
Pt presents to ED with sudden onset of epigastric abd pain, nausea, and fever of 104 that started around 2100 and 2200.  ibuprofen taken at home prior to arrival. Pt states he attempted to have a bowel movement after the onset of his symptoms but pain increased.

## 2021-06-03 ENCOUNTER — Ambulatory Visit: Admission: EM | Admit: 2021-06-03 | Discharge: 2021-06-03 | Disposition: A | Discharge status: Still In Hospital

## 2021-06-04 ENCOUNTER — Ambulatory Visit

## 2021-12-04 IMAGING — US US ABDOMEN LIMITED
1 series · 14 of 25 positions shown · non-contrast
Comparison: None.

CLINICAL DATA: Epigastric pain for 5 hours

EXAM:
ULTRASOUND ABDOMEN LIMITED RIGHT UPPER QUADRANT

[Series 1: us abdomen limited ruq · 14 of 42 slices shown]
[im 1/42]
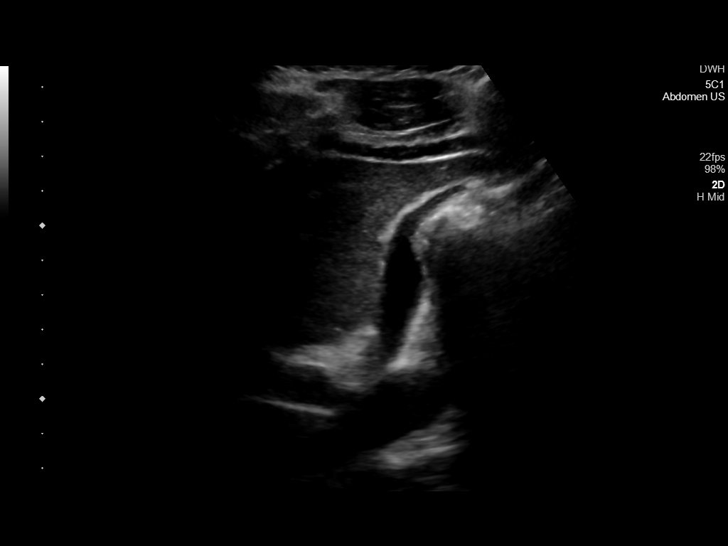
[im 4/42]
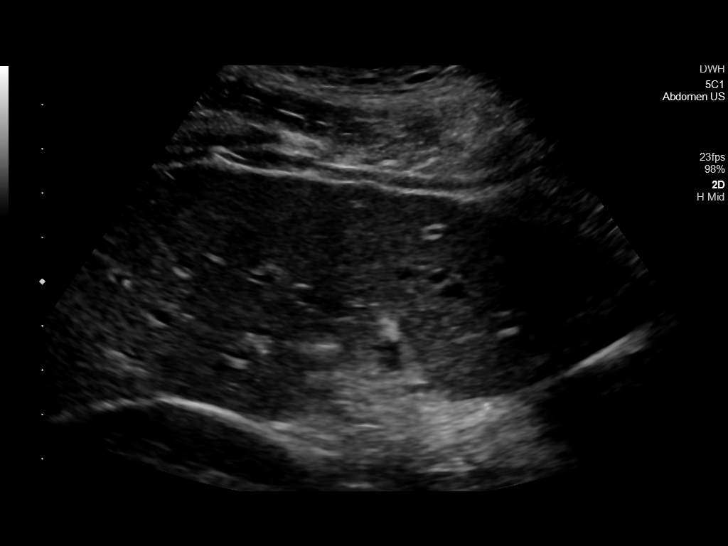
[im 7/42]
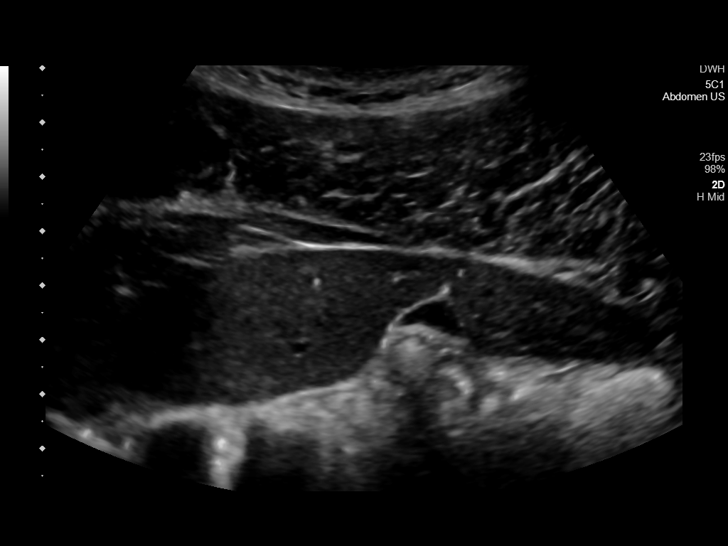
[im 11/42]
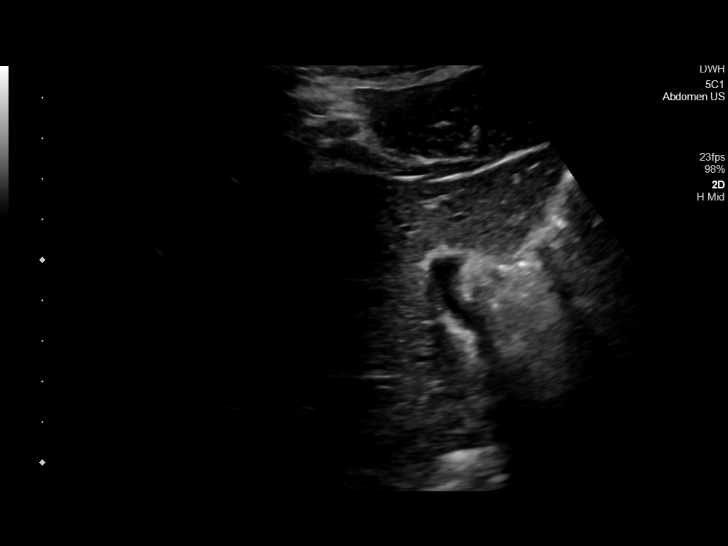
[im 14/42]
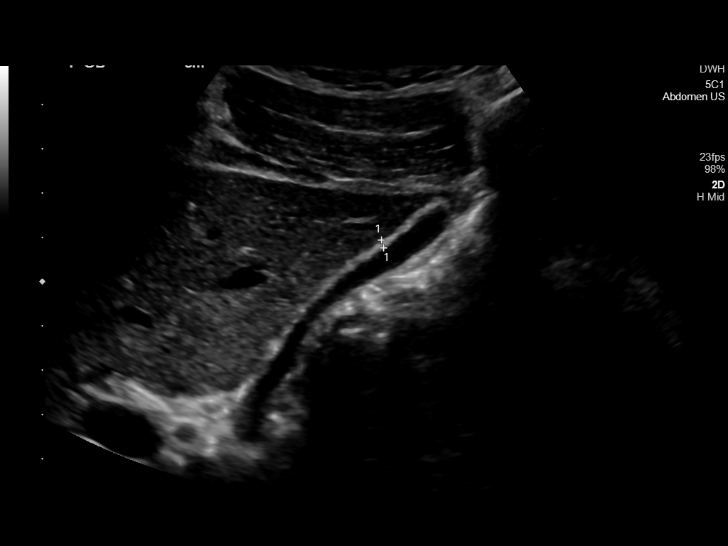
[im 16/42]
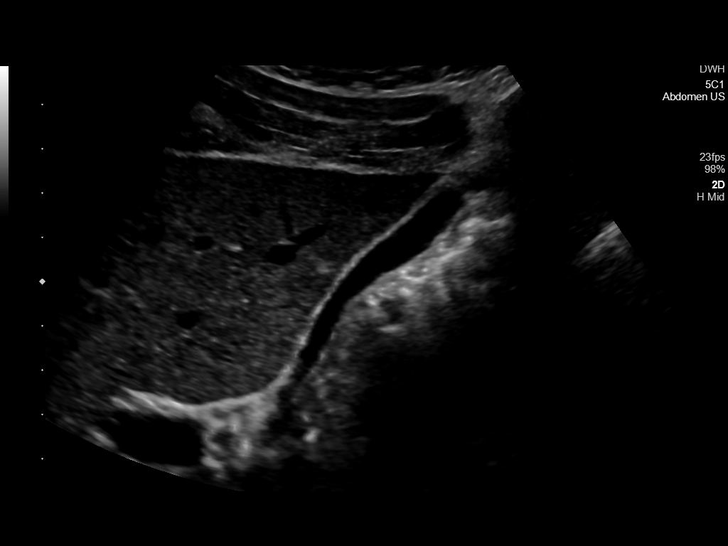
[im 19/42]
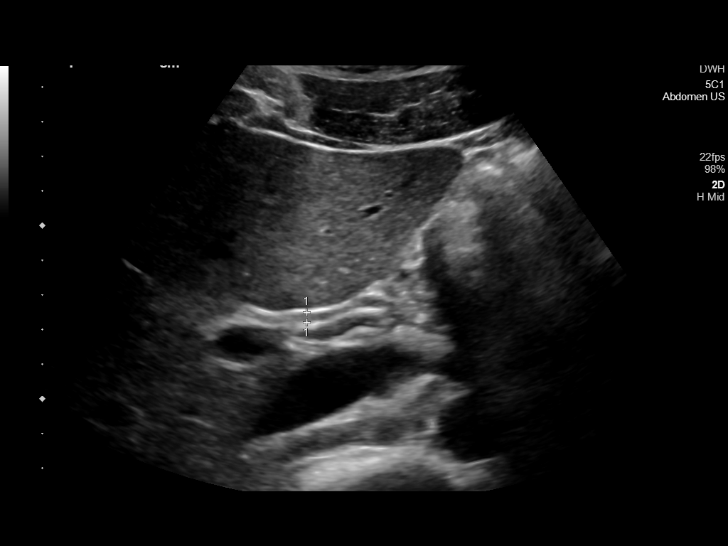
[im 23/42]
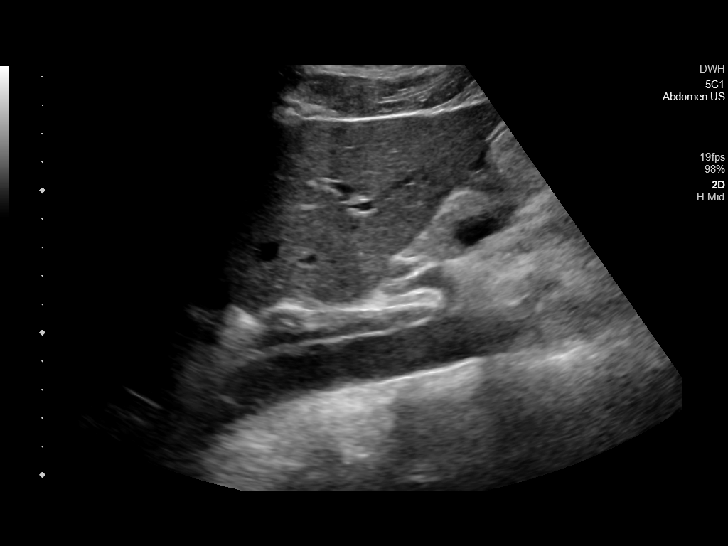
[im 26/42]
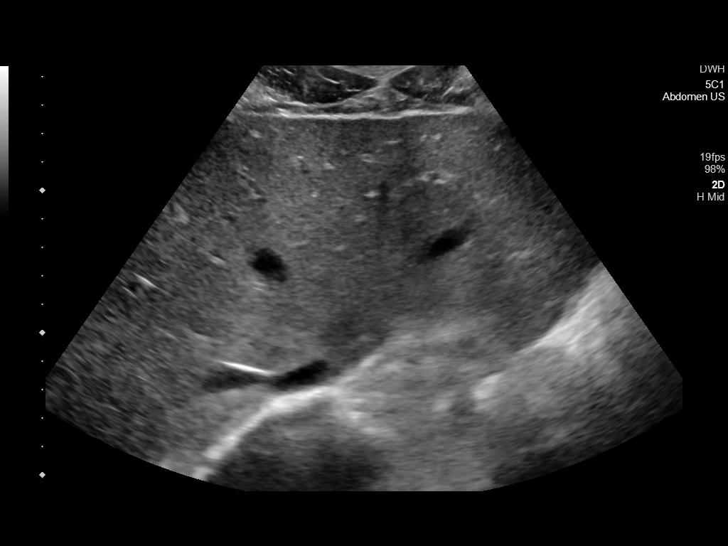
[im 28/42]
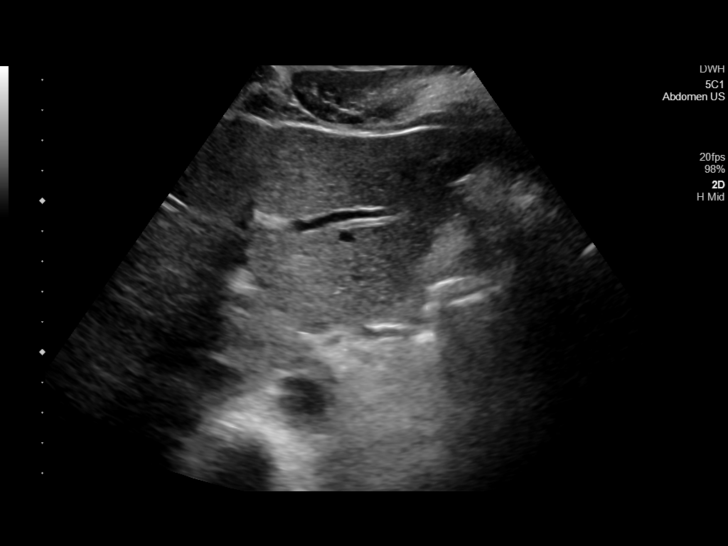
[im 31/42]
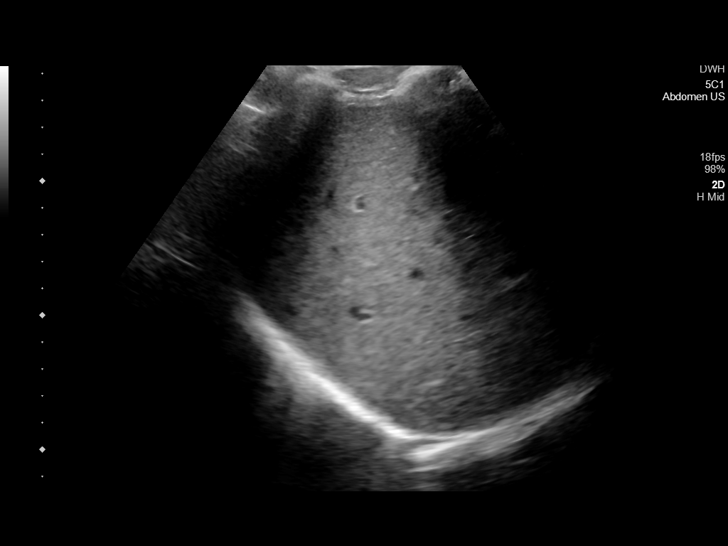
[im 35/42]
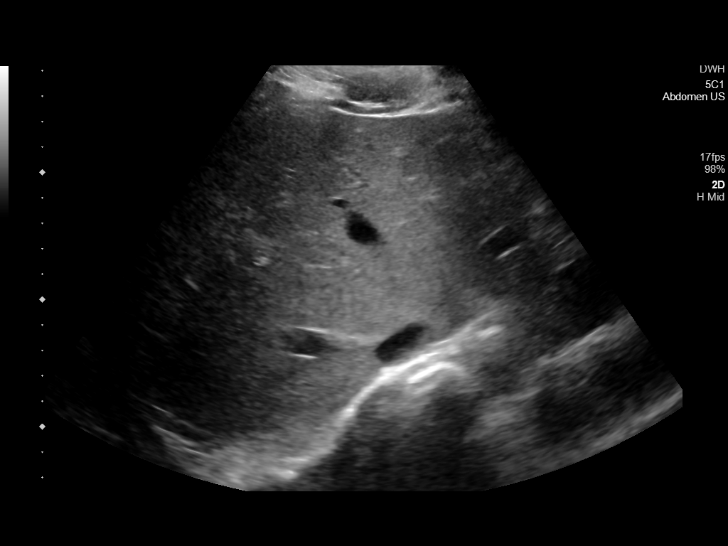
[im 38/42]
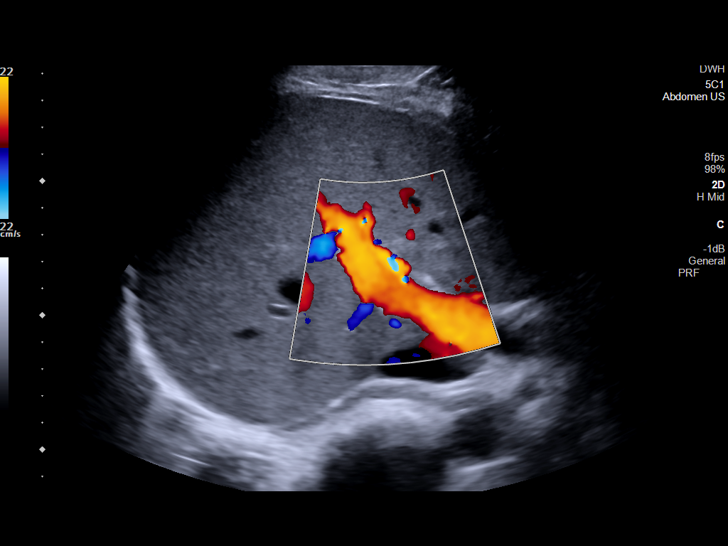
[im 42/42]
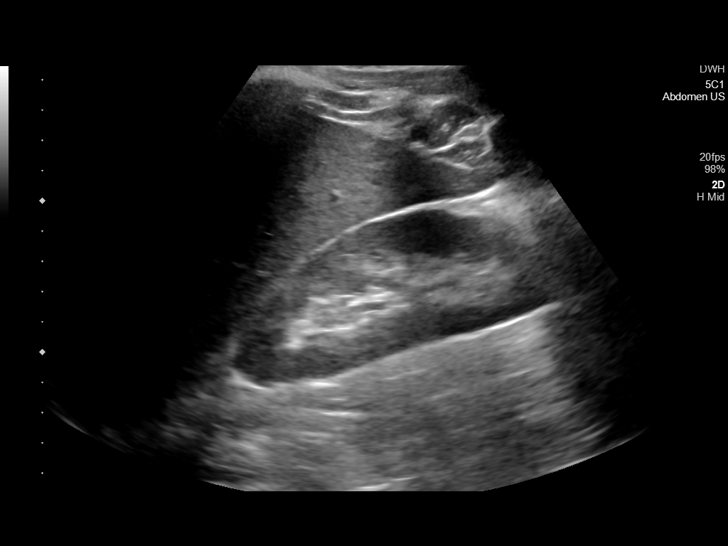

[14 of 25 positions shown; findings below may reference images not displayed]

FINDINGS: Gallbladder:

Gallbladder is only minimally distended which limits its evaluation.
No evidence of cholelithiasis or cholecystitis.

Common bile duct:

Diameter: 3 mm

Liver:

No focal lesion identified. Within normal limits in parenchymal
echogenicity. Portal vein is patent on color Doppler imaging with
normal direction of blood flow towards the liver.

Other: None.
IMPRESSION: 1. Minimal distension of the gallbladder which limits evaluation. No
evidence of cholelithiasis or cholecystitis.
2. Otherwise unremarkable right upper quadrant ultrasound.
